# Patient Record
Sex: Female | Born: 1940 | Race: White | Hispanic: No | Marital: Married
Health system: Southern US, Community
[De-identification: ages and names within clinical notes are randomized; demographics above are authoritative.]

---

## 2009-02-11 ENCOUNTER — Ambulatory Visit (HOSPITAL_COMMUNITY): Admission: RE | Admit: 2009-02-11 | Discharge: 2009-02-11 | Payer: Self-pay | Admitting: Ophthalmology

## 2010-07-02 LAB — CBC
HCT: 43.1 % (ref 36.0–46.0)
MCHC: 35.2 g/dL (ref 30.0–36.0)
MCV: 90.2 fL (ref 78.0–100.0)
Platelets: 364 10*3/uL (ref 150–400)
RBC: 4.78 MIL/uL (ref 3.87–5.11)
WBC: 9.9 10*3/uL (ref 4.0–10.5)

## 2010-07-02 LAB — COMPREHENSIVE METABOLIC PANEL
AST: 41 U/L — ABNORMAL HIGH (ref 0–37)
BUN: 8 mg/dL (ref 6–23)
CO2: 29 mEq/L (ref 19–32)
Calcium: 9.3 mg/dL (ref 8.4–10.5)
Chloride: 99 mEq/L (ref 96–112)
Creatinine, Ser: 0.69 mg/dL (ref 0.4–1.2)
GFR calc Af Amer: >60 mL/min (ref >60)
GFR calc non Af Amer: >60 mL/min (ref >60)
Total Bilirubin: 0.8 mg/dL (ref 0.3–1.2)

## 2010-07-02 LAB — URINALYSIS, ROUTINE W REFLEX MICROSCOPIC
Nitrite: NEGATIVE
Specific Gravity, Urine: 1.02 (ref 1.005–1.030)
Urobilinogen, UA: 0.2 mg/dL (ref 0.0–1.0)
pH: 7 (ref 5.0–8.0)

## 2011-07-06 IMAGING — CR DG CHEST 2V
2 series · 2 of 2 positions shown · non-contrast
Comparison: None.

CLINICAL DATA: Preop.

CHEST - 2 VIEW

[view not recorded (1 of 2)]
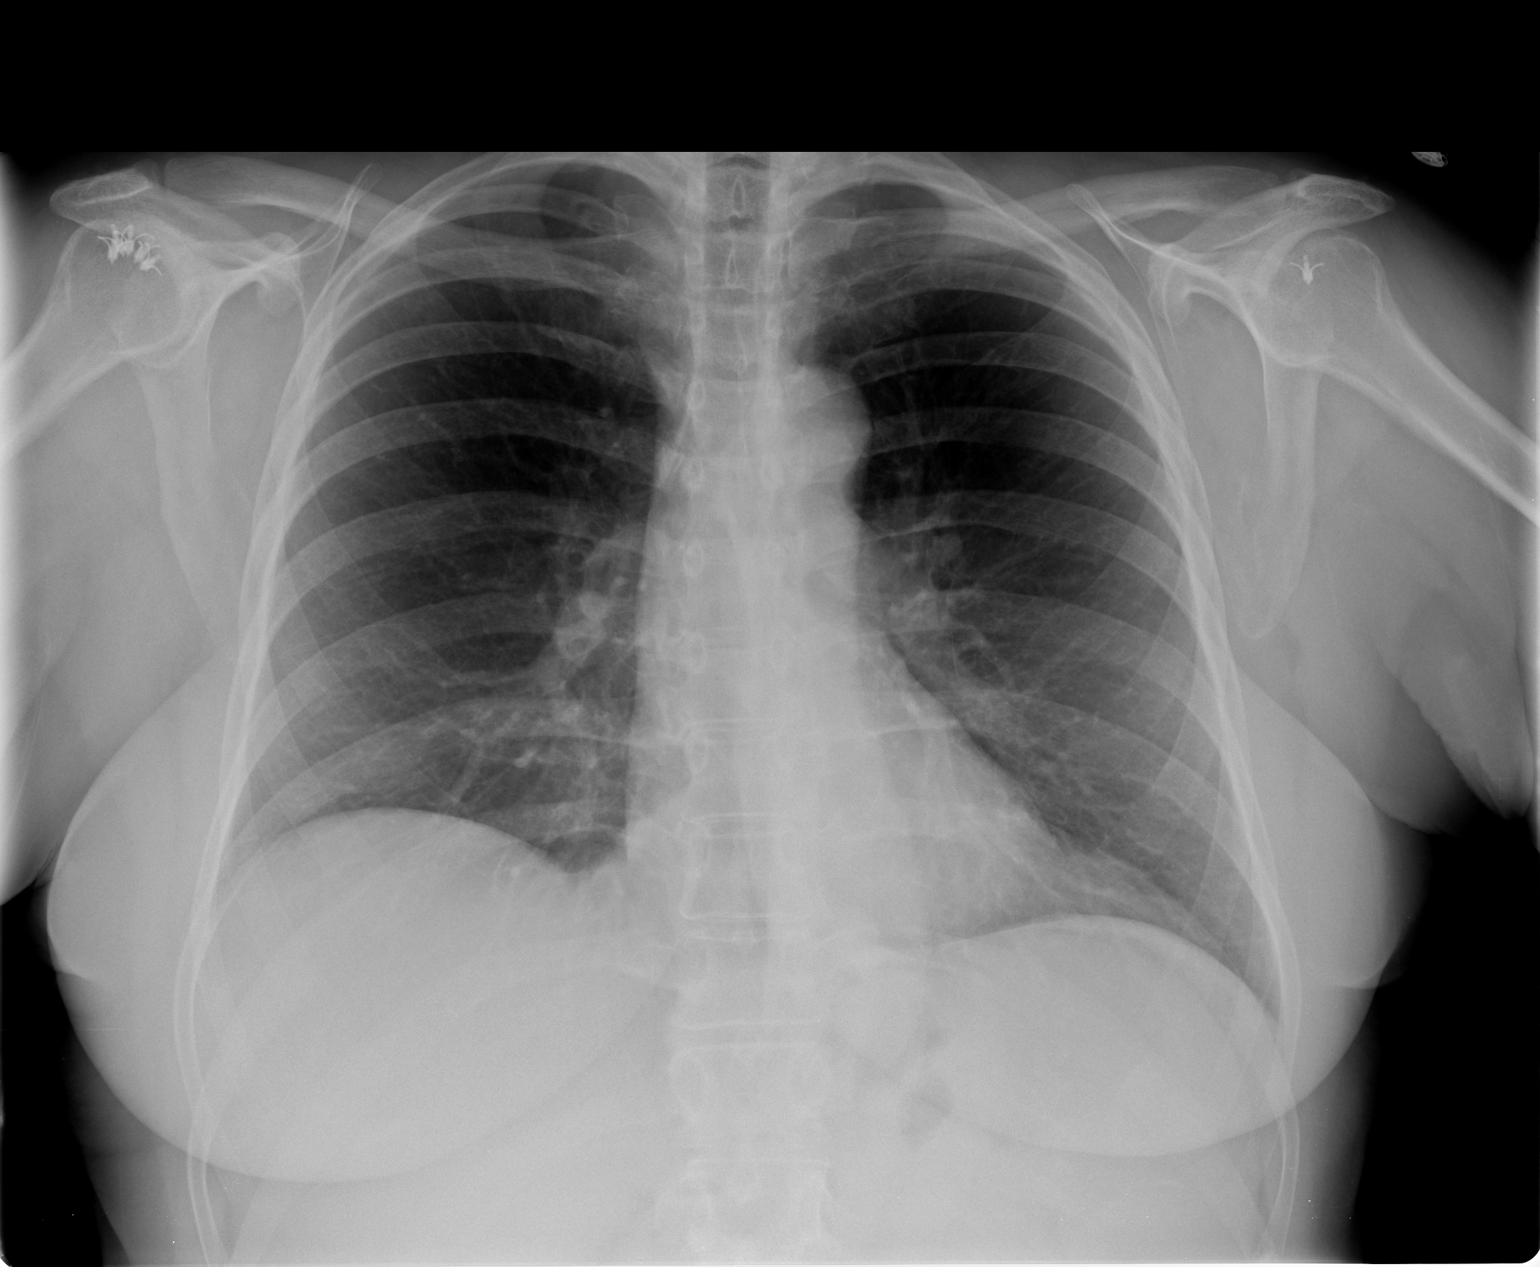

[view not recorded (2 of 2)]
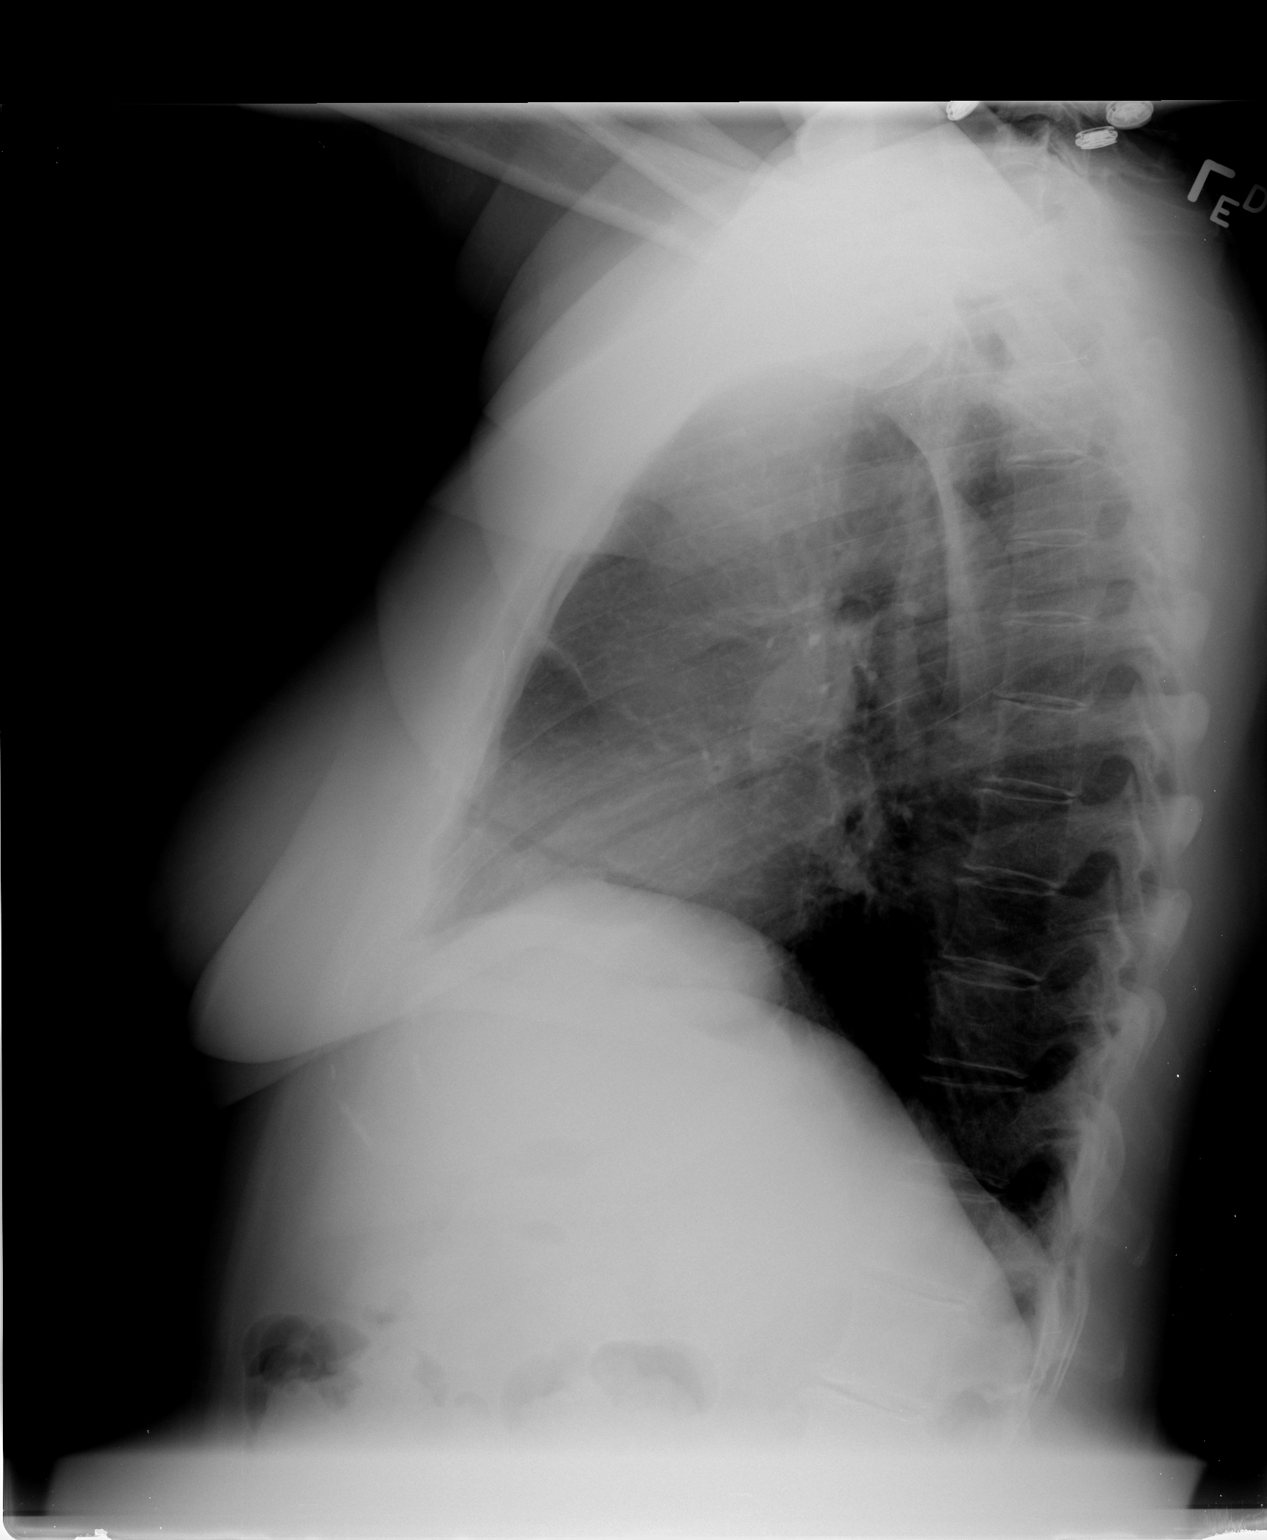

[2 of 2 positions shown; findings below may reference images not displayed]

FINDINGS: Trachea is midline.  Heart size normal.  Lungs are clear.
No pleural fluid.  Mitek anchors are seen along both humeral heads.
IMPRESSION: No acute findings.

## 2022-03-05 ENCOUNTER — Ambulatory Visit (INDEPENDENT_AMBULATORY_CARE_PROVIDER_SITE_OTHER): Payer: Medicare Other

## 2022-03-05 ENCOUNTER — Ambulatory Visit: Payer: Medicare Other | Admitting: Podiatrist

## 2022-03-05 ENCOUNTER — Encounter: Payer: Self-pay | Admitting: Podiatrist

## 2022-03-05 DIAGNOSIS — M79672 Pain in left foot: Secondary | ICD-10-CM

## 2022-03-05 DIAGNOSIS — M79671 Pain in right foot: Secondary | ICD-10-CM

## 2022-03-05 DIAGNOSIS — M7752 Other enthesopathy of left foot: Secondary | ICD-10-CM

## 2022-03-05 DIAGNOSIS — M7751 Other enthesopathy of right foot: Secondary | ICD-10-CM

## 2022-03-05 MED ORDER — TRIAMCINOLONE ACETONIDE 10 MG/ML IJ SUSP
10.0000 mg | Freq: Once | INTRAMUSCULAR | Status: AC
  Administered 2022-03-05: 10 mg

## 2022-03-05 NOTE — Patient Instructions (Signed)
Purchase some Gel Heel cups to wear in your shoes-  available over the counter at drug stores/ [employer]/ [employer]  Use Voltaren Gel in addition to the aspercreme on your feet-  this will help reduce any inflammation in your feet and ankles.

## 2022-03-05 NOTE — Progress Notes (Signed)
Chief Complaint  Patient presents with   Foot Pain    Bil foot pain     HPI: Patient is 81 y.o. female who presents today with her husband for pain of the left foot and pain in the posterior heel of the right foot.  She relates the pain on the right foot feels like a stone bruise and the pain on the left foot is mostly in the medial ankle and some in the lateral ankle.  She denies any trauma or injury to bilateral feet.  She relates this has been ongoing for 3 to 4 weeks.  She also relates she has neuropathy.  She has tried using Aspercreme with some relief in symptoms.  There are no problems to display for this patient.   No current outpatient medications on file prior to visit.   No current facility-administered medications on file prior to visit.    Not on File  Review of Systems No fevers, chills, nausea, muscle aches, no difficulty breathing, no calf pain, no chest pain or shortness of breath.   Physical Exam  GENERAL APPEARANCE: Alert, conversant. Appropriately groomed. No acute distress.   VASCULAR: Pedal pulses palpable 2/4 DP and  PT bilateral.  Capillary refill time is immediate to all digits,  Proximal to distal cooling is warm to warm.  Digital perfusion adequate.   NEUROLOGIC: sensation is intact to 5.07 monofilament at 3/5 sites bilateral.  Light touch is intact bilateral, vibratory sensation decreased bilateral  MUSCULOSKELETAL: acceptable muscle strength, tone and stability bilateral.  No gross boney pedal deformities noted.  Pain along the dorsal medial aspect of the left ankle at the area of the tibialis anterior.  Some pain at the tibialis posterior tendon insertion is also noted.  Diffuse pain along the lateral left ankle is also noted.  Right foot pain is localized to the posterior plantar central heel.  No pain with medial to lateral squeeze.  DERMATOLOGIC: skin is warm, supple, and dry.  Color, texture, and turgor of skin within normal limits.  No open wounds  are noted.  No preulcerative lesions are seen.  Digital nails are asymptomatic.    X-rays 3 views of bilateral feet are obtained.   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  Slight increase in first intermetatarsal angle is noted on the right.  No inferior calcaneal spurring is noted right.  Accessory posterior type process of the talus is noted right. X-ray left foot less arthritic changes are noted in the left ankle versus the right.  No inferior calcaneal spurring is noted.  No acute osseous abnormalities are seen left or right.  Assessment     ICD-10-CM   1. Pain of both heels  M79.671 DG Foot Complete Left   M79.672 DG Foot Complete Right    2. Capsulitis of left ankle  M77.52     3. Capsulitis of right ankle  M77.51        Plan  Discussed treatment options and alternatives.  Discussed this is likely a soft tissue inflammation and pain and discussed steroid injections.  She would like to proceed I agree to proceed with alcohol infiltrated Kenalog 10 and Marcaine plain into the area of maximal tenderness medial ankle left.  She tolerated this well.  I also infiltrated 10 mg of Kenalog and Marcaine plain into the plantar posterior right heel without complication.  She tolerated both injections very well.  Recommended gel heel cups as well as use of Voltaren gel in addition to her Aspercreme.  She will call in the future if the injections fail to provide relief.  And will be seen back..  As needed
